# Patient Record
Sex: Female | Born: 1995 | Race: White | Hispanic: No
Health system: Southern US, Community
[De-identification: ages and names within clinical notes are randomized; demographics above are authoritative.]

## PROBLEM LIST (undated history)

## (undated) DIAGNOSIS — F909 Attention-deficit hyperactivity disorder, unspecified type: Secondary | ICD-10-CM

## (undated) DIAGNOSIS — O139 Gestational [pregnancy-induced] hypertension without significant proteinuria, unspecified trimester: Secondary | ICD-10-CM

## (undated) DIAGNOSIS — A749 Chlamydial infection, unspecified: Secondary | ICD-10-CM

## (undated) HISTORY — PX: PATENT DUCTUS ARTERIOUS REPAIR: SHX269

## (undated) HISTORY — DX: Chlamydial infection, unspecified: A74.9

## (undated) HISTORY — DX: Attention-deficit hyperactivity disorder, unspecified type: F90.9

---
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
## 2016-04-07 ENCOUNTER — Ambulatory Visit: Payer: Self-pay | Admitting: Adult Health

## 2016-04-13 ENCOUNTER — Ambulatory Visit: Payer: Self-pay | Admitting: Adult Health

## 2016-05-05 ENCOUNTER — Encounter: Payer: Self-pay | Admitting: Adult Health

## 2016-05-05 ENCOUNTER — Ambulatory Visit (INDEPENDENT_AMBULATORY_CARE_PROVIDER_SITE_OTHER): Payer: 59 | Admitting: Adult Health

## 2016-05-05 VITALS — BP 100/60 | HR 82 | Ht 63.5 in | Wt 161.0 lb

## 2016-05-05 DIAGNOSIS — N926 Irregular menstruation, unspecified: Secondary | ICD-10-CM | POA: Diagnosis not present

## 2016-05-05 DIAGNOSIS — Z30011 Encounter for initial prescription of contraceptive pills: Secondary | ICD-10-CM | POA: Diagnosis not present

## 2016-05-05 DIAGNOSIS — Z3202 Encounter for pregnancy test, result negative: Secondary | ICD-10-CM

## 2016-05-05 LAB — POCT URINE PREGNANCY: Preg Test, Ur: NEGATIVE

## 2016-05-05 MED ORDER — NORETHIN-ETH ESTRAD-FE BIPHAS 1 MG-10 MCG / 10 MCG PO TABS: 1.0000 | ORAL_TABLET | Freq: Every day | ORAL | 4 refills | Status: AC

## 2016-05-05 NOTE — Progress Notes (Signed)
Subjective:     Patient ID: [name] AranKatelyn [name], female   DOB: 08/02/1996, 20 y.o.   MRN: [id number]  HPI Konrad FelixKatelyn is a 20 year old white female in for UPT, she has missed a period, it is about a week late, and she wants to get on birth control.   Review of Systems + missed period Reviewed past medical,surgical, social and family history. Reviewed medications and allergies.     Objective:   Physical Exam BP 100/60 (BP Location: Left Arm, Patient Position: Sitting, Cuff Size: Normal)   Pulse 82   Ht 5' 3.5" (1.613 m)   Wt 161 lb (73 kg)   LMP 04/11/2016   Breastfeeding? No   BMI 28.07 kg/m UPT negative, Skin warm and dry. Neck: mid line trachea, normal thyroid, good ROM, no lymphadenopathy noted. Lungs: clear to ausculation bilaterally. Cardiovascular: regular rate and rhythm.She thinks she wants the pill, but interested in IUD too, does not want nexplanon. Will rx  Lo loestrin for now.    Assessment:     1. Missed period   2. Pregnancy examination or test, negative result   3. Encounter for initial prescription of contraceptive pills       Plan:     Rx lo loestrin disp 3 packs with 4 refills, start with next period and use condoms Follow up in 3 months Review handout on mirena

## 2016-05-05 NOTE — Patient Instructions (Signed)
Start lo loestrin with next period Use condoms Follow up in 3 months

## 2016-12-01 ENCOUNTER — Ambulatory Visit (INDEPENDENT_AMBULATORY_CARE_PROVIDER_SITE_OTHER): Payer: Commercial Managed Care - PPO | Admitting: Women's Health

## 2016-12-01 ENCOUNTER — Encounter: Payer: Self-pay | Admitting: Women's Health

## 2016-12-01 VITALS — BP 100/60 | HR 80 | Ht 63.0 in | Wt 165.0 lb

## 2016-12-01 DIAGNOSIS — Z3009 Encounter for other general counseling and advice on contraception: Secondary | ICD-10-CM | POA: Diagnosis not present

## 2016-12-01 DIAGNOSIS — Z113 Encounter for screening for infections with a predominantly sexual mode of transmission: Secondary | ICD-10-CM

## 2016-12-01 NOTE — Progress Notes (Signed)
   [facility] Visit  Patient name: [name] [name] MRN [id number]  Date of birth: Dec 13, 1995  CC & HPI:  [name] [name] is a 21 y.o. G37P1001 Caucasian female presenting today for report of Lt breast 'tightness' feels like it could have been muscle, b/c it is relaxed now.  Wants STD screening, specifically concerned about HSV as she had sex w/ someone who had sex w/ a girl w/ HSV. Discussed serum HSV testing is not very specific, could result in false +, still wants to do screening, as well as screen for all other STDs. Denies any s/s STD, no abnormal d/c, itching/odor/irritation.  Wants birth control, is interested in IUD. Last sex 11/23/16.  Patient's last menstrual period was 11/20/2016. The current method of family planning is none. Last pap <21yo  Pertinent History Reviewed:  Medical & Surgical Hx:   Past medical, surgical, family, and social history reviewed in electronic medical record Medications: Reviewed & Updated - see associated section Allergies: Reviewed in electronic medical record  Objective Findings:  Vitals: BP 100/60   Pulse 80   Ht  (1.6 m)   Wt 165 lb (74.8 kg)   LMP 11/20/2016   BMI 29.23 kg/m  Body mass index is 29.23 kg/m.  Physical Examination: General appearance - alert, well appearing, and in no distress Breasts - breasts appear normal, no suspicious masses, no skin or nipple changes or axillary nodes Pelvic - no signs of HSV outbreak, normal appearing cx, no abnormal d/c or odor, gc/ct swab collected  No results found for this or any previous visit (from the past 24 hour(s)).   Assessment & Plan:  A:   Resolved Lt breast 'tightness', was likely muscular  STD screen  Contraception counseling  P:  GC/CT, HIV, RPR, Hep B, HSV2 today  Abstinence until IUD insertion  Return in about 6 days (around 12/07/2016) for IUD insertion.  Marge Duncans CNM, [facility] 12/01/2016 2:44 PM

## 2016-12-01 NOTE — Patient Instructions (Signed)
NO SEX UNTIL AFTER YOU GET YOUR BIRTH CONTROL   Levonorgestrel intrauterine device (IUD) What is this medicine? LEVONORGESTREL IUD (LEE voe nor jes trel) is a contraceptive (birth control) device. The device is placed inside the uterus by a healthcare professional. It is used to prevent pregnancy. This device can also be used to treat heavy bleeding that occurs during your period. This medicine may be used for other purposes; ask your health care provider or pharmacist if you have questions. COMMON BRAND NAME(S): Kyleena, LILETTA, Mirena, Skyla What should I tell my health care provider before I take this medicine? They need to know if you have any of these conditions: -abnormal Pap smear -cancer of the breast, uterus, or cervix -diabetes -endometritis -genital or pelvic infection now or in the past -have more than one sexual partner or your partner has more than one partner -heart disease -history of an ectopic or tubal pregnancy -immune system problems -IUD in place -liver disease or tumor -problems with blood clots or take blood-thinners -seizures -use intravenous drugs -uterus of unusual shape -vaginal bleeding that has not been explained -an unusual or allergic reaction to levonorgestrel, other hormones, silicone, or polyethylene, medicines, foods, dyes, or preservatives -pregnant or trying to get pregnant -breast-feeding How should I use this medicine? This device is placed inside the uterus by a health care professional. Talk to your pediatrician regarding the use of this medicine in children. Special care may be needed. Overdosage: If you think you have taken too much of this medicine contact a poison control center or emergency room at once. NOTE: This medicine is only for you. Do not share this medicine with others. What if I miss a dose? This does not apply. Depending on the brand of device you have inserted, the device will need to be replaced every 3 to 5 years if you  wish to continue using this type of birth control. What may interact with this medicine? Do not take this medicine with any of the following medications: -amprenavir -bosentan -fosamprenavir This medicine may also interact with the following medications: -aprepitant -armodafinil -barbiturate medicines for inducing sleep or treating seizures -bexarotene -boceprevir -griseofulvin -medicines to treat seizures like carbamazepine, ethotoin, felbamate, oxcarbazepine, phenytoin, topiramate -modafinil -pioglitazone -rifabutin -rifampin -rifapentine -some medicines to treat HIV infection like atazanavir, efavirenz, indinavir, lopinavir, nelfinavir, tipranavir, ritonavir -St. John's wort -warfarin This list may not describe all possible interactions. Give your health care provider a list of all the medicines, herbs, non-prescription drugs, or dietary supplements you use. Also tell them if you smoke, drink alcohol, or use illegal drugs. Some items may interact with your medicine. What should I watch for while using this medicine? Visit your doctor or health care professional for regular check ups. See your doctor if you or your partner has sexual contact with others, becomes HIV positive, or gets a sexual transmitted disease. This product does not protect you against HIV infection (AIDS) or other sexually transmitted diseases. You can check the placement of the IUD yourself by reaching up to the top of your vagina with clean fingers to feel the threads. Do not pull on the threads. It is a good habit to check placement after each menstrual period. Call your doctor right away if you feel more of the IUD than just the threads or if you cannot feel the threads at all. The IUD may come out by itself. You may become pregnant if the device comes out. If you notice that the IUD has come out   use a backup birth control method like condoms and call your health care provider. Using tampons will not change the  position of the IUD and are okay to use during your period. This IUD can be safely scanned with magnetic resonance imaging (MRI) only under specific conditions. Before you have an MRI, tell your healthcare provider that you have an IUD in place, and which type of IUD you have in place. What side effects may I notice from receiving this medicine? Side effects that you should report to your doctor or health care professional as soon as possible: -allergic reactions like skin rash, itching or hives, swelling of the face, lips, or tongue -fever, flu-like symptoms -genital sores -high blood pressure -no menstrual period for 6 weeks during use -pain, swelling, warmth in the leg -pelvic pain or tenderness -severe or sudden headache -signs of pregnancy -stomach cramping -sudden shortness of breath -trouble with balance, talking, or walking -unusual vaginal bleeding, discharge -yellowing of the eyes or skin Side effects that usually do not require medical attention (report to your doctor or health care professional if they continue or are bothersome): -acne -breast pain -change in sex drive or performance -changes in weight -cramping, dizziness, or faintness while the device is being inserted -headache -irregular menstrual bleeding within first 3 to 6 months of use -nausea This list may not describe all possible side effects. Call your doctor for medical advice about side effects. You may report side effects to FDA at 1-800-FDA-1088. Where should I keep my medicine? This does not apply. NOTE: This sheet is a summary. It may not cover all possible information. If you have questions about this medicine, talk to your doctor, pharmacist, or health care provider.  2018 Elsevier/Gold Standard (2016-05-20 14:14:56)  

## 2016-12-04 LAB — GC/CHLAMYDIA PROBE AMP
Chlamydia trachomatis, NAA: POSITIVE — AB
NEISSERIA GONORRHOEAE BY PCR: NEGATIVE

## 2016-12-05 ENCOUNTER — Other Ambulatory Visit: Payer: Self-pay | Admitting: Women's Health

## 2016-12-05 DIAGNOSIS — A749 Chlamydial infection, unspecified: Secondary | ICD-10-CM | POA: Insufficient documentation

## 2016-12-05 MED ORDER — AZITHROMYCIN 500 MG PO TABS: 1000.0000 mg | ORAL_TABLET | Freq: Once | ORAL | 0 refills | Status: AC

## 2016-12-06 ENCOUNTER — Telehealth: Payer: Self-pay | Admitting: *Deleted

## 2016-12-06 ENCOUNTER — Telehealth: Payer: Self-pay | Admitting: Women's Health

## 2016-12-06 NOTE — Telephone Encounter (Signed)
Pt called stating that she just missed a nurse phone call regarding her results and would like a call back. Please contact

## 2016-12-06 NOTE — Telephone Encounter (Signed)
Left message to return call regarding results. JSY

## 2016-12-07 ENCOUNTER — Telehealth: Payer: Self-pay | Admitting: Women's Health

## 2016-12-07 ENCOUNTER — Ambulatory Visit: Payer: Commercial Managed Care - PPO | Admitting: Women's Health

## 2016-12-07 NOTE — Telephone Encounter (Signed)
Informed patient that she could not get her IUD placed today due to +CT, rx at pharmacy and she will need to wait until poc neg. Needs follow-up in 3-[redacted] weeks along with no sex until cure. Patient also states she is no longer with anyone. Will schedule POC.  Verbal order called to pharmacy for Azithromycin

## 2016-12-07 NOTE — Telephone Encounter (Signed)
[name], RN spoke with pt giving her results. [name] [name]

## 2016-12-07 NOTE — Telephone Encounter (Signed)
LM, need to notify of +CT, rx at pharmacy, and can not have IUD placed today- will need to wait until poc neg.  Cheral Marker, CNM, [facility] 12/07/2016 11:07 AM

## 2017-01-04 ENCOUNTER — Ambulatory Visit: Payer: Commercial Managed Care - PPO | Admitting: Women's Health

## 2017-01-18 ENCOUNTER — Ambulatory Visit: Payer: Commercial Managed Care - PPO | Admitting: Women's Health

## 2017-01-26 ENCOUNTER — Ambulatory Visit: Payer: Commercial Managed Care - PPO | Admitting: Women's Health

## 2017-03-13 ENCOUNTER — Ambulatory Visit (HOSPITAL_COMMUNITY)
Admission: RE | Admit: 2017-03-13 | Discharge: 2017-03-13 | Disposition: A | Discharge status: Home / Self Care | Payer: Medicaid Other | Attending: Psychiatry | Admitting: Psychiatry

## 2017-03-13 DIAGNOSIS — F329 Major depressive disorder, single episode, unspecified: Secondary | ICD-10-CM | POA: Insufficient documentation

## 2017-03-13 DIAGNOSIS — G4710 Hypersomnia, unspecified: Secondary | ICD-10-CM | POA: Insufficient documentation

## 2017-03-13 DIAGNOSIS — F909 Attention-deficit hyperactivity disorder, unspecified type: Secondary | ICD-10-CM | POA: Insufficient documentation

## 2017-03-13 DIAGNOSIS — F1210 Cannabis abuse, uncomplicated: Secondary | ICD-10-CM | POA: Insufficient documentation
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
## 2017-03-13 NOTE — H&P (Signed)
Behavioral Health Medical Screening Exam  [name] [name] is an 21 y.o. female, accompanied with [name] [name] and  Three year old son. Presents with concerns with intrusive negative thoughts, depression characterized by hypersomnia, hopelessness, despair and sadness. Patient is denying SI with intent, plan or means. Patient endorses daily cannabis use and remote hx of illicit drug use and ADHD. Patient is also denying nay HI, AVH, hypo-mania or paranoia.  Total Time spent with patient: 20 minutes  Psychiatric Specialty Exam: Physical Exam  Constitutional: She is oriented to person, place, and time. She appears well-developed and well-nourished. No distress.  HENT:  Head: Normocephalic.  Eyes: Pupils are equal, round, and reactive to light.  Respiratory: Effort normal and breath sounds normal.  GI: Soft. Bowel sounds are normal.  Neurological: She is alert and oriented to person, place, and time. No cranial nerve deficit.  Skin: Skin is warm and dry. She is not diaphoretic.  Psychiatric: [name] speech is not rapid and/or pressured. Cognition and memory are impaired. She expresses impulsivity. She exhibits a depressed mood. She expresses no homicidal and no suicidal ideation.    Review of Systems  Psychiatric/Behavioral: Positive for depression and substance abuse. Negative for hallucinations and suicidal ideas. The patient is not nervous/anxious.     There were no vitals taken for this visit.There is no height or weight on file to calculate BMI.  General Appearance: Casual  Eye Contact:  Good  Speech:  Clear and Coherent  Volume:  Normal  Mood:  Depressed  Affect:  Congruent  Thought Process:  Goal directed  Orientation:  Full (Time, Place, and Person)  Thought Content:  Negative  Suicidal Thoughts:  No  Homicidal Thoughts:  No  Memory:  Immediate;   Fair  Judgement:  Good  Insight:  Lacking  Psychomotor Activity:  Negative  Concentration: Concentration: Fair and Attention Span: Fair   Recall:  [employer]  Fund of Knowledge:Poor  Language: Fair  Akathisia:  Negative  Handed:  Right  AIMS (if indicated):     Assets:  Desire for Improvement  Sleep:       Musculoskeletal: Strength & Muscle Tone: within normal limits Gait & Station: normal Patient leans: N/A  There were no vitals taken for this visit.  Recommendations:  Based on my evaluation the patient does not appear to have an emergency medical condition.  Kerry HoughSpencer E Isis Costanza, PA-C 03/13/2017, 10:16 PM

## 2019-11-01 ENCOUNTER — Ambulatory Visit: Payer: Self-pay | Attending: Internal Medicine

## 2024-04-04 ENCOUNTER — Encounter (HOSPITAL_COMMUNITY): Payer: Self-pay | Admitting: Emergency Medicine

## 2024-04-04 ENCOUNTER — Emergency Department (HOSPITAL_COMMUNITY)
Admission: EM | Admit: 2024-04-04 | Discharge: 2024-04-04 | Disposition: A | Discharge status: Home / Self Care | Attending: Emergency Medicine | Admitting: Emergency Medicine

## 2024-04-04 ENCOUNTER — Emergency Department (HOSPITAL_COMMUNITY)

## 2024-04-04 ENCOUNTER — Other Ambulatory Visit: Payer: Self-pay

## 2024-04-04 DIAGNOSIS — N3000 Acute cystitis without hematuria: Secondary | ICD-10-CM | POA: Insufficient documentation

## 2024-04-04 DIAGNOSIS — R1030 Lower abdominal pain, unspecified: Secondary | ICD-10-CM | POA: Diagnosis present

## 2024-04-04 LAB — COMPREHENSIVE METABOLIC PANEL WITH GFR
ALT: 14 U/L (ref 0–44)
AST: 15 U/L (ref 15–41)
Albumin: 4.6 g/dL (ref 3.5–5.0)
Alkaline Phosphatase: 87 U/L (ref 38–126)
Anion gap: 14 (ref 5–15)
BUN: 7 mg/dL (ref 6–20)
CO2: 20 mmol/L — ABNORMAL LOW (ref 22–32)
Calcium: 9.3 mg/dL (ref 8.9–10.3)
Chloride: 99 mmol/L (ref 98–111)
Creatinine, Ser: 0.63 mg/dL (ref 0.44–1.00)
GFR, Estimated: >60 mL/min (ref >60)
Glucose, Bld: 92 mg/dL (ref 70–99)
Potassium: 3.2 mmol/L — ABNORMAL LOW (ref 3.5–5.1)
Sodium: 133 mmol/L — ABNORMAL LOW (ref 135–145)
Total Bilirubin: 2.9 mg/dL — ABNORMAL HIGH (ref 0.0–1.2)
Total Protein: 8.1 g/dL (ref 6.5–8.1)

## 2024-04-04 LAB — CBC
HCT: 41.5 % (ref 36.0–46.0)
Hemoglobin: 14.7 g/dL (ref 12.0–15.0)
MCH: 30.9 pg (ref 26.0–34.0)
MCHC: 35.4 g/dL (ref 30.0–36.0)
MCV: 87.2 fL (ref 80.0–100.0)
Platelets: 209 10*3/uL (ref 150–400)
RBC: 4.76 MIL/uL (ref 3.87–5.11)
RDW: 11.9 % (ref 11.5–15.5)
WBC: 10.8 10*3/uL — ABNORMAL HIGH (ref 4.0–10.5)
nRBC: 0 % (ref 0.0–0.2)

## 2024-04-04 LAB — URINALYSIS, ROUTINE W REFLEX MICROSCOPIC
Bacteria, UA: NONE SEEN
Bilirubin Urine: NEGATIVE
Glucose, UA: NEGATIVE mg/dL
Hgb urine dipstick: NEGATIVE
Ketones, ur: 20 mg/dL — AB
Nitrite: NEGATIVE
Protein, ur: NEGATIVE mg/dL
Specific Gravity, Urine: 1.003 — ABNORMAL LOW (ref 1.005–1.030)
pH: 6 (ref 5.0–8.0)

## 2024-04-04 LAB — POC URINE PREG, ED: Preg Test, Ur: NEGATIVE

## 2024-04-04 LAB — LIPASE, BLOOD: Lipase: 25 U/L (ref 11–51)

## 2024-04-04 MED ORDER — CEPHALEXIN 500 MG PO CAPS: 500.0000 mg | ORAL_CAPSULE | Freq: Two times a day (BID) | ORAL | 0 refills | Status: AC

## 2024-04-04 MED ORDER — IOHEXOL 300 MG/ML  SOLN
100.0000 mL | Freq: Once | INTRAMUSCULAR | Status: AC | PRN
  Administered 2024-04-04: 100 mL via INTRAVENOUS

## 2024-04-04 NOTE — ED Notes (Signed)
 ED Provider at bedside.

## 2024-04-04 NOTE — ED Notes (Signed)
 Patient transported to CT

## 2024-04-04 NOTE — ED Provider Notes (Signed)
 [place] EMERGENCY DEPARTMENT AT [facility] Provider Note   CSN: [id number] Arrival date & time: 04/04/24  1933     Patient presents with: Abdominal Pain   [name] [name] is a 28 y.o. female.  28 year old female presents to the ED with complaints of suprapubic pain, constipation, nausea vomiting since Sunday.  Patient reports she was seen at another emergency department and they ultrasound her abdomen and diagnosed her with UTI and prescribed her doxycycline.  Patient endorses yellow vaginal discharge since Sunday as well and reports being sexually active.  Patient has concern for her IUD which has been in for approximately 8 years now.  Patient is not established with OB/GYN in the area.  Patient reports last bowel movement was on Monday and it was soft stool.  Last episode of vomiting was yesterday.  Patient reports lower p.o. intake since Sunday.    Prior to Admission medications   Medication Sig Start Date End Date Taking? Authorizing Provider  cephALEXin  (KEFLEX ) 500 MG capsule Take 1 capsule (500 mg total) by mouth 2 (two) times daily. 04/04/24  Yes Myriam Fonda RAMAN, PA-C  Norethindrone-Ethinyl Estradiol-Fe Biphas (LO LOESTRIN FE) 1 MG-10 MCG / 10 MCG tablet Take 1 tablet by mouth daily. Take 1 daily by mouth Patient not taking: Reported on 12/01/2016 05/05/16   Signa Delon LABOR, NP    Allergies: Patient has no known allergies.    Review of Systems  Gastrointestinal:  Positive for abdominal pain, constipation, nausea and vomiting.  Genitourinary:  Positive for pelvic pain and vaginal discharge.  All other systems reviewed and are negative.   Updated Vital Signs BP 130/78   Pulse (!) 109   Temp 98.5 F (36.9 C)   Resp 18   SpO2 99%   Physical Exam Vitals and nursing note reviewed.  Constitutional:      General: She is not in acute distress.    Appearance: She is well-developed.  HENT:     Head: Normocephalic and atraumatic.  Eyes:      Conjunctiva/sclera: Conjunctivae normal.  Cardiovascular:     Rate and Rhythm: Normal rate and regular rhythm.     Heart sounds: No murmur heard. Pulmonary:     Effort: Pulmonary effort is normal. No respiratory distress.     Breath sounds: Normal breath sounds.  Abdominal:     Palpations: Abdomen is soft.     Tenderness: There is abdominal tenderness in the right upper quadrant and suprapubic area. There is no right CVA tenderness or left CVA tenderness.  Genitourinary:    Vagina: Vaginal discharge present.  Musculoskeletal:        General: No swelling.     Cervical back: Neck supple.  Skin:    General: Skin is warm and dry.     Capillary Refill: Capillary refill takes less than 2 seconds.  Neurological:     Mental Status: She is alert.  Psychiatric:        Mood and Affect: Mood normal.     (all labs ordered are listed, but only abnormal results are displayed) Labs Reviewed  COMPREHENSIVE METABOLIC PANEL WITH GFR - Abnormal; Notable for the following components:      Result Value   Sodium 133 (*)    Potassium 3.2 (*)    CO2 20 (*)    Total Bilirubin 2.9 (*)    All other components within normal limits  CBC - Abnormal; Notable for the following components:   WBC 10.8 (*)    All  other components within normal limits  URINALYSIS, ROUTINE W REFLEX MICROSCOPIC - Abnormal; Notable for the following components:   Color, Urine STRAW (*)    Specific Gravity, Urine 1.003 (*)    Ketones, ur 20 (*)    Leukocytes,Ua TRACE (*)    All other components within normal limits  LIPASE, BLOOD  POC URINE PREG, ED    EKG: None  Radiology: CT ABDOMEN PELVIS W CONTRAST Result Date: 04/04/2024 CLINICAL DATA:  Acute nonlocalized abdominal pain EXAM: CT ABDOMEN AND PELVIS WITH CONTRAST TECHNIQUE: Multidetector CT imaging of the abdomen and pelvis was performed using the standard protocol following bolus administration of intravenous contrast. RADIATION DOSE REDUCTION: This exam was performed  according to the departmental dose-optimization program which includes automated exposure control, adjustment of the mA and/or kV according to patient size and/or use of iterative reconstruction technique. CONTRAST:  OMNIPAQUE  IOHEXOL  300 MG/ML  SOLN COMPARISON:  03/24/2007 FINDINGS: Lower chest: No acute abnormality. Hepatobiliary: No focal liver abnormality is seen. No gallstones, gallbladder wall thickening, or biliary dilatation. Pancreas: Unremarkable Spleen: Unremarkable Adrenals/Urinary Tract: Adrenal glands are unremarkable. The kidneys are normal in size and position. There is mild bilateral hydronephrosis, peripelvic and periureteric inflammatory stranding, and urothelial enhancement keeping with urothelial inflammation in keeping with probable bilateral ascending urinary tract infection. No perinephric fluid collections. No intrarenal or ureteral calculi. Normal cortical enhancement. The bladder is unremarkable. Stomach/Bowel: Stomach is within normal limits. Appendix appears normal. No evidence of bowel wall thickening, distention, or inflammatory changes. Vascular/Lymphatic: No significant vascular findings are present. No enlarged abdominal or pelvic lymph nodes. Reproductive: Intrauterine device in expected position within retroverted uterus. The pelvic organs are otherwise unremarkable. Other: No abdominal wall hernia or abnormality. No abdominopelvic ascites. Musculoskeletal: No acute or significant osseous findings. IMPRESSION: 1. Mild bilateral hydronephrosis, peripelvic and periureteric inflammatory stranding, and urothelial enhancement in keeping with probable bilateral ascending urinary tract infection. Correlation with urinalysis and urine culture may be helpful for further management. No perinephric fluid collections. No intrarenal or ureteral calculi. Electronically Signed   By: Dorethia Molt M.D.   On: 04/04/2024 23:09     Procedures   Medications Ordered in the ED  iohexol   (OMNIPAQUE ) 300 MG/ML solution 100 mL (100 mLs Intravenous Contrast Given 04/04/24 2234)    28 y.o. female presents to the ED for concern of Abdominal Pain     This involves an extensive number of treatment options, and is a complaint that carries with it a high risk of complications and morbidity.  The emergent differential diagnosis prior to evaluation includes, but is not limited to: UTI, pyelonephritis, ovarian abscess, ectopic pregnancy, PID, colitis  This is not an exhaustive differential.   Past Medical History / Co-morbidities / Social History: Hx of PDA, chlamydia, pregnancy-induced hypertension Social Determinants of Health include: Tobacco  Additional History:  Obtained by chart review.  Notably surgical history for PDA, previous OB/GYN visit for IUD.  Lab Tests: I ordered, and personally interpreted labs.  The pertinent results include: No acute abnormalities on labs.  UA with positive ketones and trace leukocytes but no white blood cells.   Imaging Studies: I ordered imaging studies including CT abdomen pelvis with contrast.   I independently visualized and interpreted imaging which showed mild bilateral hydronephrosis and stranding consistent with ascending UTI. I agree with the radiologist interpretation.   ED Course / Critical Interventions: Pt well-appearing on exam.  On exam patient has tenderness to the suprapubic area, and the right upper quadrant.  Bowel sounds are present.  No active vomiting and patient endorses mild nausea.  Lungs are clear to auscultation and patient denies any shortness of breath, chest pain, fevers, or dizziness.  Patient has no active chills and does not feel feverish.  She has endorsed chills at home.  Upon reevaluation, patient denies any nausea.  Patient was advised of CT findings.  Patient was advised to start Keflex  stop Doxy and follow-up with OB/GYN for removal of IUD. I have reviewed the patients home medicines and have made  adjustments as needed.  Disposition: Considered admission and after reviewing the patient's encounter today, I feel that the patient would benefit from discharge to follow-up with OB/GYN.  Discussed course of treatment with the patient, whom demonstrated understanding.  Patient in agreement and has no further questions.    I discussed this case with my attending, Dr. Cleotilde, who agreed with the proposed treatment course and cosigned this note including patient's presenting symptoms, physical exam, and planned diagnostics and interventions.  Attending physician stated agreement with plan or made changes to plan which were implemented.     This chart was dictated using voice recognition software.  Despite best efforts to proofread, errors can occur which can change the documentation meaning.   Final diagnoses:  Acute cystitis without hematuria    ED Discharge Orders          Ordered    cephALEXin  (KEFLEX ) 500 MG capsule  2 times daily        04/04/24 2323               Myriam Fonda GORMAN DEVONNA 04/04/24 2334    Cleotilde Rogue, MD 04/05/24 [phone]

## 2024-04-04 NOTE — Discharge Instructions (Addendum)
 Take the antibiotics as prescribed and they have been sent to [facility] in [place].  Stop taking doxycycline.  Follow-up with OB/GYN at [facility] [place].

## 2024-04-04 NOTE — ED Triage Notes (Signed)
 Pt reports uterus and ovary pain  Pt states she has been in pain many days, seen at a hospital in [place] and states they did an US  on everything but my uterus  Pt reports she had a UA and was given meds but has used all of the pain and nausea meds.
